# Patient Record
Sex: Male | Born: 1935 | Race: Black or African American | Hispanic: No | Smoking: Former smoker
Health system: Southern US, Community
[De-identification: ages and names within clinical notes are randomized; demographics above are authoritative.]

## PROBLEM LIST (undated history)

## (undated) DIAGNOSIS — E785 Hyperlipidemia, unspecified: Secondary | ICD-10-CM

## (undated) DIAGNOSIS — IMO0001 Reserved for inherently not codable concepts without codable children: Secondary | ICD-10-CM

## (undated) DIAGNOSIS — E119 Type 2 diabetes mellitus without complications: Secondary | ICD-10-CM

## (undated) DIAGNOSIS — I10 Essential (primary) hypertension: Secondary | ICD-10-CM

## (undated) DIAGNOSIS — K5792 Diverticulitis of intestine, part unspecified, without perforation or abscess without bleeding: Secondary | ICD-10-CM

## (undated) DIAGNOSIS — G8929 Other chronic pain: Secondary | ICD-10-CM

## (undated) DIAGNOSIS — D649 Anemia, unspecified: Secondary | ICD-10-CM

## (undated) DIAGNOSIS — M549 Dorsalgia, unspecified: Secondary | ICD-10-CM

## (undated) DIAGNOSIS — F209 Schizophrenia, unspecified: Secondary | ICD-10-CM

## (undated) DIAGNOSIS — F419 Anxiety disorder, unspecified: Secondary | ICD-10-CM

## (undated) DIAGNOSIS — K219 Gastro-esophageal reflux disease without esophagitis: Secondary | ICD-10-CM

---

## 2015-01-18 ENCOUNTER — Emergency Department (HOSPITAL_BASED_OUTPATIENT_CLINIC_OR_DEPARTMENT_OTHER): Payer: Medicare Other

## 2015-01-18 ENCOUNTER — Encounter (HOSPITAL_BASED_OUTPATIENT_CLINIC_OR_DEPARTMENT_OTHER): Payer: Self-pay | Admitting: *Deleted

## 2015-01-18 ENCOUNTER — Emergency Department (HOSPITAL_BASED_OUTPATIENT_CLINIC_OR_DEPARTMENT_OTHER)
Admission: EM | Admit: 2015-01-18 | Discharge: 2015-01-18 | Disposition: A | Discharge status: Other Acute Inpatient Hospital | Payer: Medicare Other | Attending: Emergency Medicine | Admitting: Emergency Medicine

## 2015-01-18 DIAGNOSIS — Z79899 Other long term (current) drug therapy: Secondary | ICD-10-CM | POA: Insufficient documentation

## 2015-01-18 DIAGNOSIS — G4751 Confusional arousals: Secondary | ICD-10-CM | POA: Diagnosis not present

## 2015-01-18 DIAGNOSIS — G8929 Other chronic pain: Secondary | ICD-10-CM | POA: Diagnosis not present

## 2015-01-18 DIAGNOSIS — R5383 Other fatigue: Secondary | ICD-10-CM | POA: Diagnosis not present

## 2015-01-18 DIAGNOSIS — F419 Anxiety disorder, unspecified: Secondary | ICD-10-CM | POA: Insufficient documentation

## 2015-01-18 DIAGNOSIS — Z7984 Long term (current) use of oral hypoglycemic drugs: Secondary | ICD-10-CM | POA: Insufficient documentation

## 2015-01-18 DIAGNOSIS — I10 Essential (primary) hypertension: Secondary | ICD-10-CM | POA: Insufficient documentation

## 2015-01-18 DIAGNOSIS — R634 Abnormal weight loss: Secondary | ICD-10-CM | POA: Insufficient documentation

## 2015-01-18 DIAGNOSIS — C259 Malignant neoplasm of pancreas, unspecified: Secondary | ICD-10-CM | POA: Diagnosis not present

## 2015-01-18 DIAGNOSIS — K831 Obstruction of bile duct: Secondary | ICD-10-CM | POA: Insufficient documentation

## 2015-01-18 DIAGNOSIS — Z862 Personal history of diseases of the blood and blood-forming organs and certain disorders involving the immune mechanism: Secondary | ICD-10-CM | POA: Insufficient documentation

## 2015-01-18 DIAGNOSIS — F209 Schizophrenia, unspecified: Secondary | ICD-10-CM | POA: Diagnosis not present

## 2015-01-18 DIAGNOSIS — R17 Unspecified jaundice: Secondary | ICD-10-CM | POA: Insufficient documentation

## 2015-01-18 DIAGNOSIS — F0390 Unspecified dementia without behavioral disturbance: Secondary | ICD-10-CM | POA: Insufficient documentation

## 2015-01-18 DIAGNOSIS — R4781 Slurred speech: Secondary | ICD-10-CM | POA: Diagnosis not present

## 2015-01-18 DIAGNOSIS — C801 Malignant (primary) neoplasm, unspecified: Secondary | ICD-10-CM

## 2015-01-18 DIAGNOSIS — Z87891 Personal history of nicotine dependence: Secondary | ICD-10-CM | POA: Insufficient documentation

## 2015-01-18 DIAGNOSIS — E119 Type 2 diabetes mellitus without complications: Secondary | ICD-10-CM | POA: Insufficient documentation

## 2015-01-18 DIAGNOSIS — Z7951 Long term (current) use of inhaled steroids: Secondary | ICD-10-CM | POA: Insufficient documentation

## 2015-01-18 DIAGNOSIS — R531 Weakness: Secondary | ICD-10-CM | POA: Insufficient documentation

## 2015-01-18 DIAGNOSIS — R4182 Altered mental status, unspecified: Secondary | ICD-10-CM | POA: Diagnosis present

## 2015-01-18 DIAGNOSIS — K8689 Other specified diseases of pancreas: Secondary | ICD-10-CM | POA: Diagnosis not present

## 2015-01-18 DIAGNOSIS — Z87440 Personal history of urinary (tract) infections: Secondary | ICD-10-CM | POA: Diagnosis not present

## 2015-01-18 HISTORY — DX: Anemia, unspecified: D64.9

## 2015-01-18 HISTORY — DX: Diverticulitis of intestine, part unspecified, without perforation or abscess without bleeding: K57.92

## 2015-01-18 HISTORY — DX: Anxiety disorder, unspecified: F41.9

## 2015-01-18 HISTORY — DX: Type 2 diabetes mellitus without complications: E11.9

## 2015-01-18 HISTORY — DX: Schizophrenia, unspecified: F20.9

## 2015-01-18 HISTORY — DX: Other chronic pain: G89.29

## 2015-01-18 HISTORY — DX: Gastro-esophageal reflux disease without esophagitis: K21.9

## 2015-01-18 HISTORY — DX: Dorsalgia, unspecified: M54.9

## 2015-01-18 HISTORY — DX: Hyperlipidemia, unspecified: E78.5

## 2015-01-18 HISTORY — DX: Essential (primary) hypertension: I10

## 2015-01-18 HISTORY — DX: Reserved for inherently not codable concepts without codable children: IMO0001

## 2015-01-18 LAB — COMPREHENSIVE METABOLIC PANEL
ALBUMIN: 2.5 g/dL — AB (ref 3.5–5.0)
ALT: 163 U/L — AB (ref 17–63)
AST: 248 U/L — AB (ref 15–41)
Alkaline Phosphatase: 1168 U/L — ABNORMAL HIGH (ref 38–126)
Anion gap: 9 (ref 5–15)
BILIRUBIN TOTAL: 10.1 mg/dL — AB (ref 0.3–1.2)
BUN: 9 mg/dL (ref 6–20)
CHLORIDE: 100 mmol/L — AB (ref 101–111)
CO2: 26 mmol/L (ref 22–32)
Calcium: 8.9 mg/dL (ref 8.9–10.3)
Creatinine, Ser: 0.82 mg/dL (ref 0.61–1.24)
GFR calc Af Amer: >60 mL/min (ref >60)
GFR calc non Af Amer: >60 mL/min (ref >60)
GLUCOSE: 57 mg/dL — AB (ref 65–99)
POTASSIUM: 3.6 mmol/L (ref 3.5–5.1)
Sodium: 135 mmol/L (ref 135–145)
Total Protein: 6.8 g/dL (ref 6.5–8.1)

## 2015-01-18 LAB — URINALYSIS, ROUTINE W REFLEX MICROSCOPIC
Glucose, UA: NEGATIVE mg/dL
Hgb urine dipstick: NEGATIVE
Ketones, ur: 15 mg/dL — AB
NITRITE: NEGATIVE
PH: 6 (ref 5.0–8.0)
Protein, ur: NEGATIVE mg/dL
SPECIFIC GRAVITY, URINE: 1.02 (ref 1.005–1.030)

## 2015-01-18 LAB — CBC WITH DIFFERENTIAL/PLATELET
Basophils Absolute: 0 10*3/uL (ref 0.0–0.1)
Basophils Relative: 0 %
EOS ABS: 0 10*3/uL (ref 0.0–0.7)
Eosinophils Relative: 0 %
HEMATOCRIT: 29.8 % — AB (ref 39.0–52.0)
Hemoglobin: 8.9 g/dL — ABNORMAL LOW (ref 13.0–17.0)
LYMPHS ABS: 1.1 10*3/uL (ref 0.7–4.0)
LYMPHS PCT: 16 %
MCH: 27.8 pg (ref 26.0–34.0)
MCHC: 29.9 g/dL — ABNORMAL LOW (ref 30.0–36.0)
MCV: 93.1 fL (ref 78.0–100.0)
Monocytes Absolute: 0.9 10*3/uL (ref 0.1–1.0)
Monocytes Relative: 13 %
NEUTROS ABS: 4.6 10*3/uL (ref 1.7–7.7)
NEUTROS PCT: 70 %
Platelets: 377 10*3/uL (ref 150–400)
RBC: 3.2 MIL/uL — AB (ref 4.22–5.81)
RDW: 16.9 % — AB (ref 11.5–15.5)
WBC: 6.6 10*3/uL (ref 4.0–10.5)

## 2015-01-18 LAB — URINE MICROSCOPIC-ADD ON: RBC / HPF: NONE SEEN RBC/hpf (ref 0–5)

## 2015-01-18 LAB — I-STAT CG4 LACTIC ACID, ED: Lactic Acid, Venous: 2.7 mmol/L (ref 0.5–2.0)

## 2015-01-18 MED ORDER — IOHEXOL 300 MG/ML  SOLN
100.0000 mL | Freq: Once | INTRAMUSCULAR | Status: AC | PRN
  Administered 2015-01-18: 100 mL via INTRAVENOUS

## 2015-01-18 MED ORDER — SODIUM CHLORIDE 0.9 % IV SOLN
Freq: Once | INTRAVENOUS | Status: AC
  Administered 2015-01-18: 16:00:00 via INTRAVENOUS

## 2015-01-18 MED ORDER — SODIUM CHLORIDE 0.9 % IV BOLUS (SEPSIS)
1000.0000 mL | Freq: Once | INTRAVENOUS | Status: AC
  Administered 2015-01-18: 1000 mL via INTRAVENOUS

## 2015-01-18 NOTE — ED Notes (Signed)
Report to [facility] at bedside, pt care transferred, pt and daughter are aware of pending transport and inpatient admit to [facility] room c-303.

## 2015-01-18 NOTE — ED Notes (Signed)
Pt placed on NaCL @ [facility].

## 2015-01-18 NOTE — ED Provider Notes (Signed)
CSN: [id number]     Arrival date & time 01/18/15  1104 History   First MD Initiated Contact with Patient 01/18/15 1108     Chief Complaint  Patient presents with  . Jaundice     (Consider location/radiation/quality/duration/timing/severity/associated sxs/prior Treatment) Patient is a 82 y.o. male presenting with altered mental status. The history is provided by the nursing home and a relative.  Altered Mental Status Presenting symptoms: confusion and lethargy (worsening over last 2 weeks)   Severity:  Moderate Most recent episode:  More than 2 days ago Episode history:  Continuous Timing:  Constant Progression:  Unchanged Chronicity:  New Context: dementia and nursing home resident   Associated symptoms: slurred speech and weakness   Associated symptoms: no abdominal pain and no fever   Associated symptoms comment:  Progressive jaundice per daughter   Past Medical History  Diagnosis Date  . Diverticulitis   . Diabetes mellitus without complication ([place])   . Hypertension   . Chronic back pain   . Schizophrenia ([place])   . Reflux   . Anemia   . Anxiety   . HLD (hyperlipidemia)    History reviewed. No pertinent past surgical history. History reviewed. No pertinent family history. Social History  Substance Use Topics  . Smoking status: Former Research scientist (life sciences)  . Smokeless tobacco: None  . Alcohol Use: None    Review of Systems  Constitutional: Positive for appetite change, fatigue and unexpected weight change (loss). Negative for fever.  Gastrointestinal: Negative for abdominal pain.  Skin: Positive for color change (yellow).  Neurological: Positive for weakness.  Psychiatric/Behavioral: Positive for confusion.  All other systems reviewed and are negative.     Allergies  Review of patient's allergies indicates no known allergies.  Home Medications   Prior to Admission medications   Medication Sig Start Date End Date Taking? Authorizing Provider  ciprofloxacin (CIPRO)  500 MG tablet Take 500 mg by mouth 2 (two) times daily.   Yes Historical Provider, MD  divalproex (DEPAKOTE) 500 MG DR tablet Take 1,500 mg by mouth daily.   Yes Historical Provider, MD  fluticasone (VERAMYST) 27.5 MCG/SPRAY nasal spray Place 2 sprays into the nose daily.   Yes Historical Provider, MD  glimepiride (AMARYL) 4 MG tablet Take 4 mg by mouth daily with breakfast.   Yes Historical Provider, MD  HYDROcodone-acetaminophen (NORCO/VICODIN) 5-325 MG tablet Take 1 tablet by mouth every 6 (six) hours as needed for moderate pain.   Yes Historical Provider, MD  hydrOXYzine (ATARAX/VISTARIL) 25 MG tablet Take 25 mg by mouth 3 (three) times daily as needed.   Yes Historical Provider, MD  lisinopril (PRINIVIL,ZESTRIL) 5 MG tablet Take 5 mg by mouth daily.   Yes Historical Provider, MD  loratadine (CLARITIN) 10 MG tablet Take 10 mg by mouth daily.   Yes Historical Provider, MD  metFORMIN (GLUCOPHAGE) 1000 MG tablet Take 1,000 mg by mouth 2 (two) times daily with a meal.   Yes Historical Provider, MD  risperiDONE (RISPERDAL) 2 MG tablet Take 2 mg by mouth at bedtime.   Yes Historical Provider, MD  risperiDONE microspheres (RISPERDAL CONSTA) 25 MG injection Inject 25 mg into the muscle every 14 (fourteen) days.   Yes Historical Provider, MD  VITAMIN D, CHOLECALCIFEROL, PO Take by mouth.   Yes Historical Provider, MD  White Petrolatum-Mineral Oil (SYSTANE NIGHTTIME) OINT Apply to eye.   Yes Historical Provider, MD   BP 119/55 mmHg  Pulse 73  Temp(Src) 99.3 F (37.4 C) (Oral)  Resp 18  Ht  5\' 7"  (1.702 m)  Wt 172 lb (78.019 kg)  BMI 26.93 kg/m2  SpO2 96% Physical Exam  Constitutional: He appears well-developed. He appears cachectic. No distress.  HENT:  Head: Normocephalic and atraumatic.  Eyes: Conjunctivae are normal.  Scleral icterus  Neck: Neck supple. No tracheal deviation present.  Cardiovascular: Normal rate, regular rhythm and normal heart sounds.   Pulmonary/Chest: Effort normal and  breath sounds normal. No respiratory distress.  Abdominal: Soft. He exhibits no distension. There is no tenderness.  Neurological: He is alert. He is disoriented. GCS eye subscore is 4. GCS verbal subscore is 4. GCS motor subscore is 6.  Skin: Skin is warm and dry.  Jaundiced  Psychiatric: He has a normal mood and affect.    ED Course  Procedures (including critical care time) Labs Review Labs Reviewed  CBC WITH DIFFERENTIAL/PLATELET - Abnormal; Notable for the following:    RBC 3.20 (*)    Hemoglobin 8.9 (*)    HCT 29.8 (*)    MCHC 29.9 (*)    RDW 16.9 (*)    All other components within normal limits  COMPREHENSIVE METABOLIC PANEL - Abnormal; Notable for the following:    Chloride 100 (*)    Glucose, Bld 57 (*)    Albumin 2.5 (*)    AST 248 (*)    ALT 163 (*)    Alkaline Phosphatase 1168 (*)    Total Bilirubin 10.1 (*)    All other components within normal limits  URINALYSIS, ROUTINE W REFLEX MICROSCOPIC (NOT AT [facility]) - Abnormal; Notable for the following:    Color, Urine ORANGE (*)    Bilirubin Urine LARGE (*)    Ketones, ur 15 (*)    Leukocytes, UA SMALL (*)    All other components within normal limits  URINE MICROSCOPIC-ADD ON - Abnormal; Notable for the following:    Squamous Epithelial / LPF 0-5 (*)    Bacteria, UA MANY (*)    Casts GRANULAR CAST (*)    All other components within normal limits  I-STAT CG4 LACTIC ACID, ED - Abnormal; Notable for the following:    Lactic Acid, Venous 2.70 (*)    All other components within normal limits  URINE CULTURE    Imaging Review Dg Chest 2 View  01/18/2015  CLINICAL DATA:  Confusion and fatigue. History of recent urinary tract infection. EXAM: CHEST  2 VIEW COMPARISON:  None. FINDINGS: Normal cardiac silhouette given reduced lung volumes. There is partial obscuration involving the superior aspect the right hilum and caudal aspect of the right tracheal air column, potentially secondary to overlying osseous structures. There is  a punctate, presumably calcified approximately 5 mm nodule overlying the peripheral aspect the right upper lung. Minimal bibasilar linear heterogeneous opacities favored to represent atelectasis. No discrete focal airspace opacities. No pleural effusion or pneumothorax. No evidence of edema. No acute osseous abnormalities. IMPRESSION: 1. Partial obscuration of the superior aspect the right hilum, potentially secondary to overlying osseous structures though a developing airspace opacity, hilar adenopathy or medially located pulmonary nodule or mass could all result in a similar appearance. Correlation with prior outside examinations (if available) is recommended. Otherwise, a follow-up chest radiograph in 3 to 4 weeks after treatment is recommended to ensure resolution. 2. Punctate granuloma overlies the peripheral aspect the right upper lung. Electronically Signed   By: Sandi Mariscal M.D.   On: 01/18/2015 12:36   Ct Abdomen Pelvis W Contrast  01/18/2015  CLINICAL DATA:  Weakness.  Recent jaundice. EXAM:  CT ABDOMEN AND PELVIS WITH CONTRAST TECHNIQUE: Multidetector CT imaging of the abdomen and pelvis was performed using the standard protocol following bolus administration of intravenous contrast. CONTRAST:  178mL OMNIPAQUE IOHEXOL 300 MG/ML  SOLN COMPARISON:  None. FINDINGS: There is an ill-defined approximately 3.1 x 1.6 cm hypo attenuating mass within the caudal aspect of the the head/neck of the pancreas (representative axial image 38, series 2; coronal image 38, series 5) which is associated with moderate dilatation of the common bile duct measuring approximately 1.7 cm in maximal oblique coronal diameter (coronal image 41, series 5) and minimal centralized intrahepatic biliary duct dilatation. There is no definitive pancreatic ductal dilatation or downstream pancreatic atrophy. The mass appears to abut the right caudal aspect of the proximal SMA (coronal image 37, series 5) and result in short segment occlusion  of 1 of the proximal branch vessels of the SMV (image 32, series 5). The portal vein remains widely patent. Scattered porta hepatis and upper retroperitoneal lymph nodes are numerous though individually not enlarged by size criteria with index gastrohepatic lymph node measuring 0.9 cm in greatest short axis diameter (image 27, series 2). No definitive bulky retroperitoneal, mesenteric, pelvic or inguinal lymphadenopathy. Normal hepatic contour. There are multiple (approximately 5 ill-defined hypo attenuating hepatic lesions, largest of which within the caudal aspect of the right lobe of the liver measures approximately 9 mm in diameter (image 28, series 2), all of which are too small to accurately characterize though in the setting of a presumed pancreatic malignancy are worrisome for metastatic disease. Normal appearance of the gallbladder. No radiopaque gallstones. No ascites. There is symmetric enhancement and excretion of the bilateral kidneys. Note is made of an approximately 1.9 cm hypo attenuating (5 Hounsfield unit) partially exophytic cyst arising from the anterior cranial aspect of the left kidney (image 11, series 7). Additional bilateral sub cm hypoattenuating renal lesions too small to adequately characterize of favored to represent additional renal cysts. No definite renal stones this postcontrast examination. No urinary obstruction or perinephric stranding. Normal appearance of the bilateral adrenal glands and spleen. Scattered colonic diverticulosis without evidence of diverticulitis. Moderate colonic stool burden without evidence of enteric obstruction. Normal appearance of the terminal ileum and retrocecal appendix. No pneumoperitoneum, pneumatosis or portal venous gas. Scattered atherosclerotic plaque within a normal caliber abdominal aorta. The major branch vessels of the abdominal aorta appear patent on this non CTA examination. Limited visualization of the lower thorax demonstrates a  approximately 0.5 cm) nodule within the subpleural aspect of the image right middle lobe (image 1, series 3) with associated minimal amount of adjacent surrounding ground-glass. There is minimal dependent subpleural ground-glass atelectasis, right greater than left. No pleural effusion. Normal heart size.  No pericardial effusion. No acute or aggressive osseous abnormalities. Moderate multilevel lumbar spine DDD. Mild degenerative change of the bilateral hips, right greater than left with joint space loss, subchondral sclerosis and osteophytosis. Small mesenteric fat containing left-sided indirect inguinal hernia. Mild diffuse body wall anasarca, most conspicuous about the midline of the low back. IMPRESSION: 1. Ill-defined approximately 3.1 cm hypo attenuating mass arising from the caudal aspect of the head/neck of the pancreas worrisome for a primary pancreatic malignancy. This finding is associated with moderate dilatation of the CBD and mild centralized intrahepatic biliary duct dilatation. There is no definitive pancreatic duct dilatation or downstream pancreatic atrophy. Further evaluation with EUS could be performed as indicated 2. While suboptimally evaluated on this non pancreatic protocol CT, the ill-defined pancreatic neoplasm appears to abut  the SMA and results in short segment occlusion of one of the proximal branches of the SMV. The portal vein and splenoportal confluence appear widely patent and uninvolved. 3. Scattered punctate hypoattenuating hepatic lesions (the largest of which measures approximately 9 mm), too small to accurately characterize though worrisome for metastatic disease in the setting of presumed pancreatic malignancy. Further evaluation could be performed with abdominal MRI as indicated. 4. Punctate (approximately 5 mm) nodule within imaged subpleural aspect of the right middle lobe - indeterminate though pulmonary metastatic disease is not excluded on the basis of this examination.  Comparison with prior outside examinations is recommended. If outside examinations are unavailable or do not exist, continued attention on follow-up is recommended. 5. Colonic diverticulosis without evidence of diverticulitis. Electronically Signed   By: Sandi Mariscal M.D.   On: 01/18/2015 14:38   I have personally reviewed and evaluated these images and lab results as part of my medical decision-making.   EKG Interpretation None      MDM   Final diagnoses:  Pancreatic mass  Biliary obstruction due to malignant neoplasm    82 year old male presents with progressive jaundice, confusion, lethargy over the last 2-3 weeks. He was diagnosed with a urinary tract infection at his facility due to positive nitrites likely related to severe hyperbilirubinemia. Daughter states he has had loss of appetite, weight loss and progressive decline for even longer than this. His liver enzymes are markedly elevated and his clinical jaundice indicate abdominal imaging. CT demonstrates new pancreatic head mass obstructing the biliary tree. Discussed poor prognosis with his daughter who is available at bedside, recommended medical admission for evaluation and decompression. No fever, no elevation in white blood cell count, no significant vital sign abnormalities to suggest developing infection currently. Discussed with Dr. Lorelle Gibbs of [facility] who accepted the patient in transfer for further care.    Leo Grosser, MD 01/18/15 1515

## 2015-01-18 NOTE — ED Notes (Signed)
Pt to room 5 by ems, pt is a/a, pleasantly confused to his baseline ms per ems report. Ems reports they were called to [facility] for report of pt being seen and dx with uti on Monday, cont with confusion and fatigue, request recheck here today. MAR does not show administration of any antibiotics. This rn will call [facility] to inquire. Pt denies any pain, denies any c/o, states "I just don't want to eat, I got no appetite."

## 2015-01-18 NOTE — ED Notes (Signed)
Baker Janus at [facility] pt is on cipro 500mg  and had his first dose this morning.

## 2015-01-18 NOTE — ED Notes (Signed)
No medical history was sent with pt, only face/demographic sheet and mar's. Baker Janus of [facility] states she will fax asap.

## 2015-01-18 NOTE — ED Notes (Signed)
Critical Lactic Acid 2.70 reported to Dr. Laneta Simmers

## 2015-01-19 LAB — URINE CULTURE: Culture: 1000

## 2017-08-25 IMAGING — CT CT ABD-PELV W/ CM
2 of 5 series · 12 of 46 positions shown, 14 images · IV contrast (APPLIED)
Comparison: None.

CLINICAL DATA: Weakness.  Recent jaundice.

EXAM:
CT ABDOMEN AND PELVIS WITH CONTRAST
TECHNIQUE: Multidetector CT imaging of the abdomen and pelvis was performed
using the standard protocol following bolus administration of
intravenous contrast.
CONTRAST:  100mL OMNIPAQUE IOHEXOL 300 MG/ML  SOLN

[Series 2: axial st · axial · 0.95mm/px · z∈[-537,-57]mm · 9 of 120 slices shown, 11 images]
[im 12/120  soft-tissue]
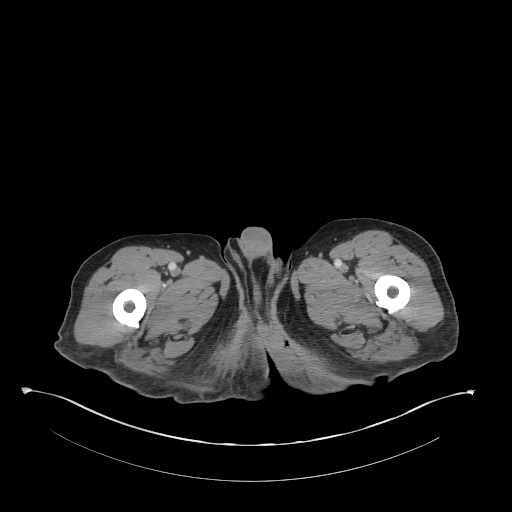
[im 12/120  bone]
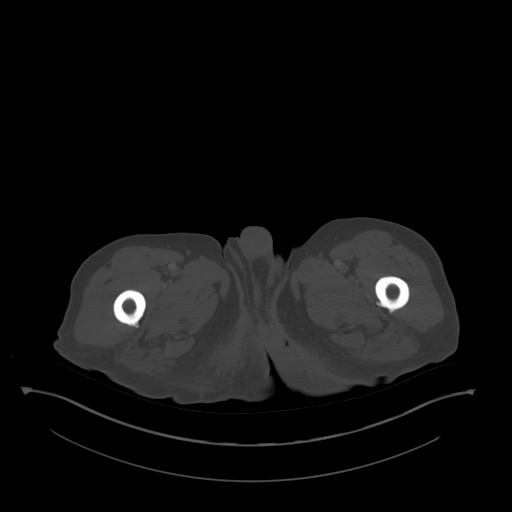
[im 23/120  soft-tissue]
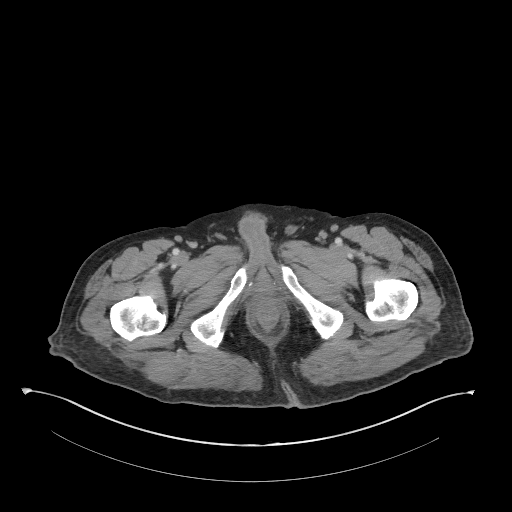
[im 35/120  soft-tissue]
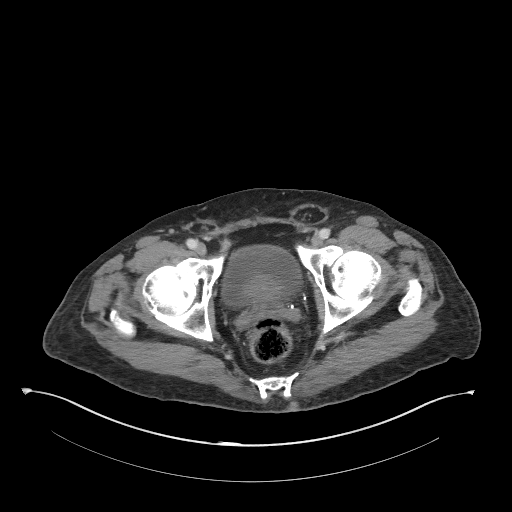
[im 46/120  soft-tissue]
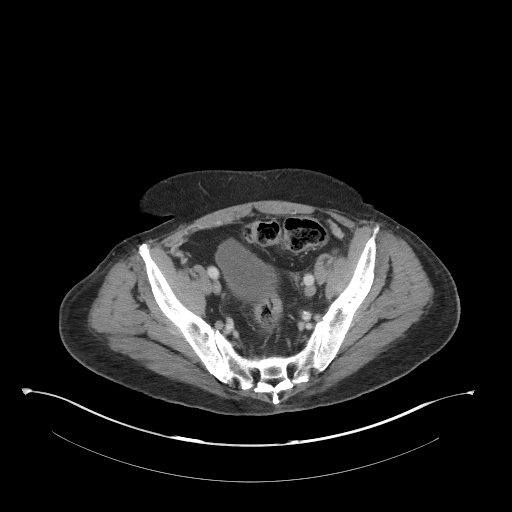
[im 63/120  soft-tissue]
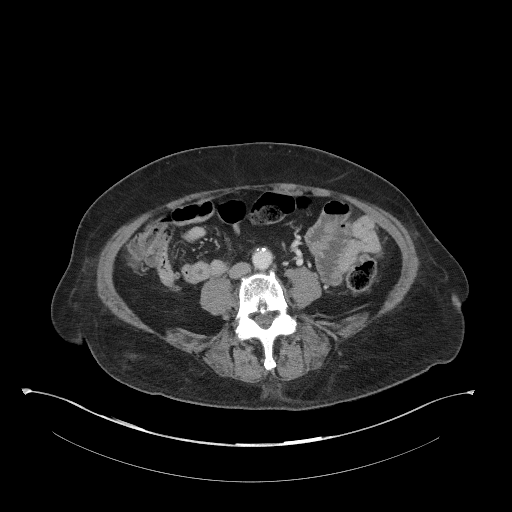
[im 74/120  soft-tissue]
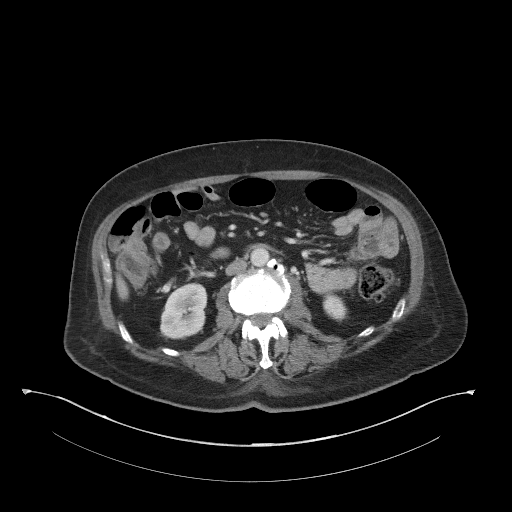
[im 86/120  soft-tissue]
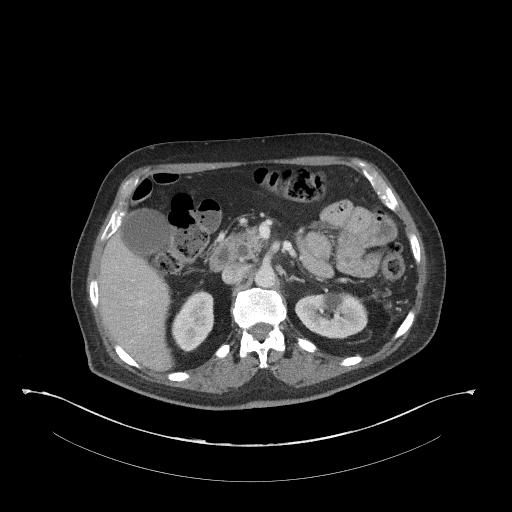
[im 97/120  soft-tissue]
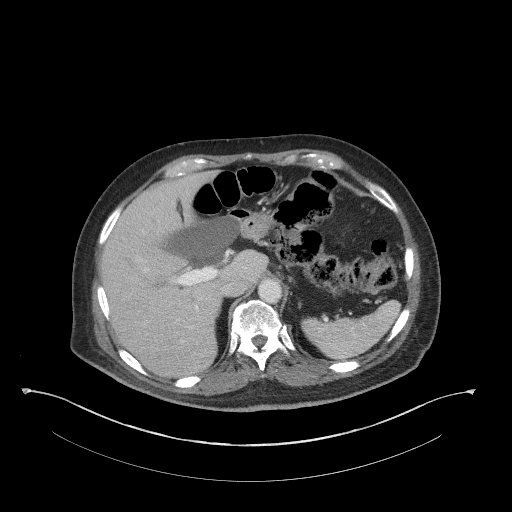
[im 108/120  soft-tissue]
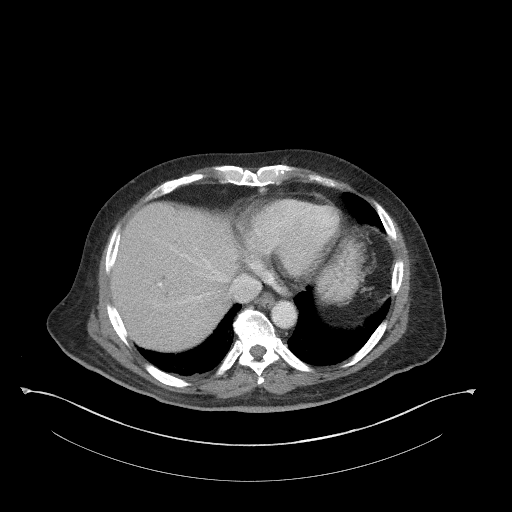
[im 108/120  bone]
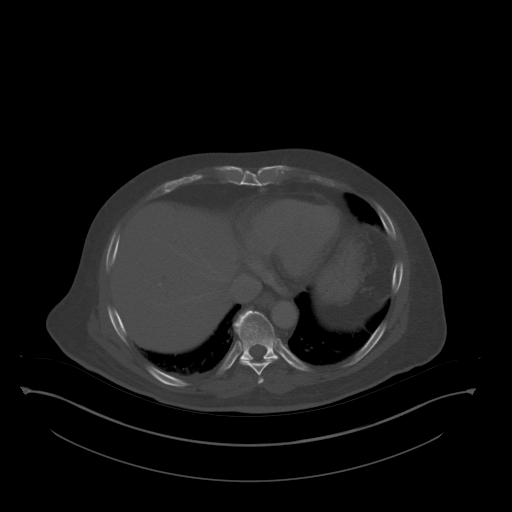

[Series 5: coronal st · coronal · 0.83mm/px · 3 of 92 slices shown]
[im 31/92  soft-tissue]
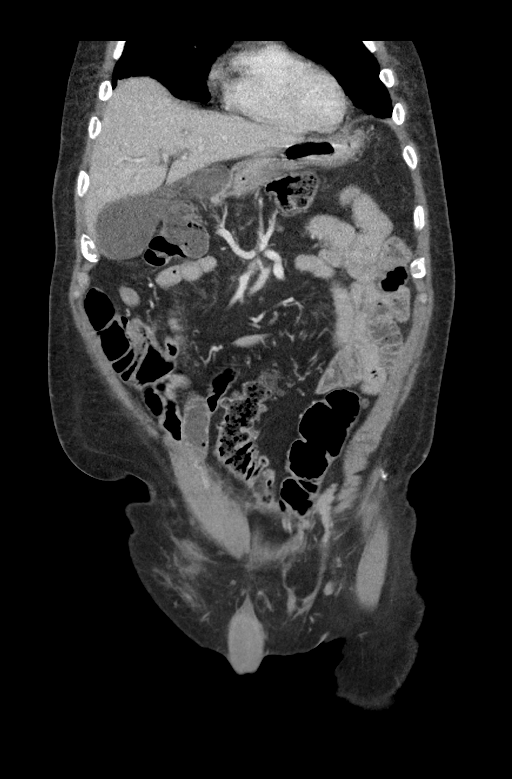
[im 41/92  soft-tissue]
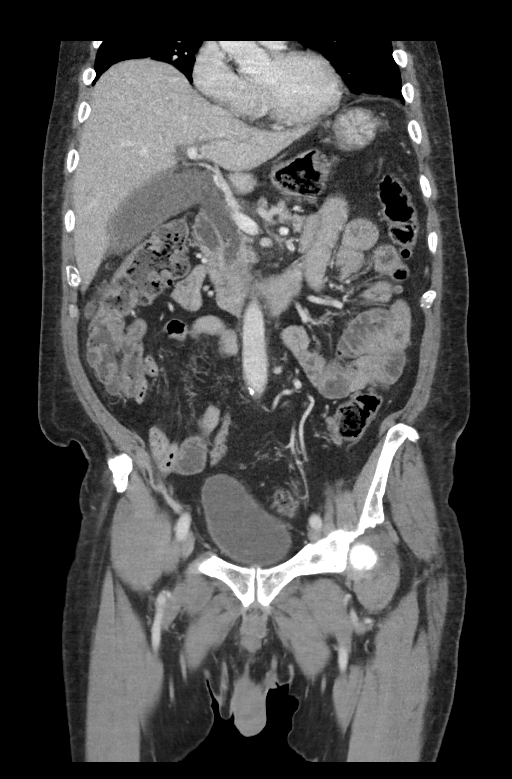
[im 51/92  soft-tissue]
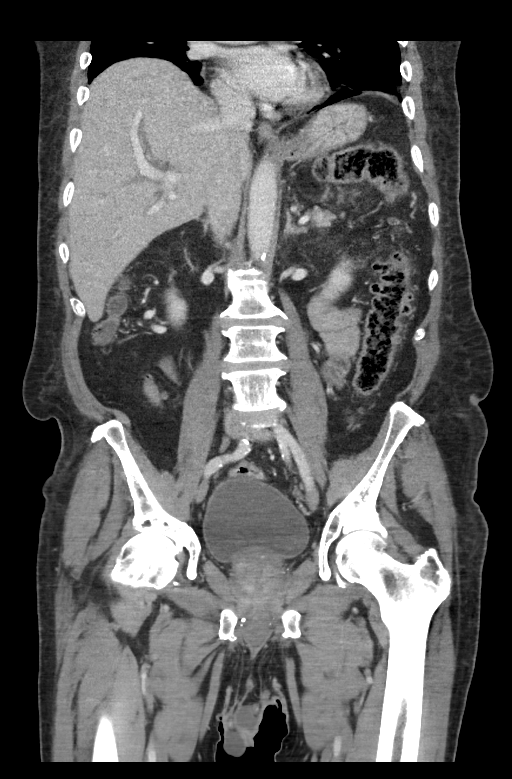

[12 of 46 positions shown; findings below may reference images not displayed]

FINDINGS: There is an ill-defined approximately 3.1 x 1.6 cm hypo attenuating
mass within the caudal aspect of the the head/neck of the pancreas
(representative axial image 38, series 2; coronal image 38, series
5) which is associated with moderate dilatation of the common bile
duct measuring approximately 1.7 cm in maximal oblique coronal
diameter (coronal image 41, series 5) and minimal centralized
intrahepatic biliary duct dilatation. There is no definitive
pancreatic ductal dilatation or downstream pancreatic atrophy. The
mass appears to abut the right caudal aspect of the proximal SMA
(coronal image 37, series 5) and result in short segment occlusion
of 1 of the proximal branch vessels of the SMV (image 32, series 5).
The portal vein remains widely patent.

Scattered porta hepatis and upper retroperitoneal lymph nodes are
numerous though individually not enlarged by size criteria with
index gastrohepatic lymph node measuring 0.9 cm in greatest short
axis diameter (image 27, series 2). No definitive bulky
retroperitoneal, mesenteric, pelvic or inguinal lymphadenopathy.

Normal hepatic contour. There are multiple (approximately 5
ill-defined hypo attenuating hepatic lesions, largest of which
within the caudal aspect of the right lobe of the liver measures
approximately 9 mm in diameter (image 28, series 2), all of which
are too small to accurately characterize though in the setting of a
presumed pancreatic malignancy are worrisome for metastatic disease.

Normal appearance of the gallbladder. No radiopaque gallstones. No
ascites.

There is symmetric enhancement and excretion of the bilateral
kidneys. Note is made of an approximately 1.9 cm hypo attenuating (5
Hounsfield unit) partially exophytic cyst arising from the anterior
cranial aspect of the left kidney (image 11, series 7). Additional
bilateral sub cm hypoattenuating renal lesions too small to
adequately characterize of favored to represent additional renal
cysts. No definite renal stones this postcontrast examination. No
urinary obstruction or perinephric stranding.

Normal appearance of the bilateral adrenal glands and spleen.

Scattered colonic diverticulosis without evidence of diverticulitis.
Moderate colonic stool burden without evidence of enteric
obstruction. Normal appearance of the terminal ileum and retrocecal
appendix. No pneumoperitoneum, pneumatosis or portal venous gas.

Scattered atherosclerotic plaque within a normal caliber abdominal
aorta. The major branch vessels of the abdominal aorta appear patent
on this non CTA examination.

Limited visualization of the lower thorax demonstrates a
approximately 0.5 cm) nodule within the subpleural aspect of the
image right middle lobe (image 1, series 3) with associated minimal
amount of adjacent surrounding ground-glass. There is minimal
dependent subpleural ground-glass atelectasis, right greater than
left. No pleural effusion.

Normal heart size.  No pericardial effusion.

No acute or aggressive osseous abnormalities. Moderate multilevel
lumbar spine DDD. Mild degenerative change of the bilateral hips,
right greater than left with joint space loss, subchondral sclerosis
and osteophytosis.

Small mesenteric fat containing left-sided indirect inguinal hernia.
Mild diffuse body wall anasarca, most conspicuous about the midline
of the low back.
IMPRESSION: 1. Ill-defined approximately 3.1 cm hypo attenuating mass arising
from the caudal aspect of the head/neck of the pancreas worrisome
for a primary pancreatic malignancy. This finding is associated with
moderate dilatation of the CBD and mild centralized intrahepatic
biliary duct dilatation. There is no definitive pancreatic duct
dilatation or downstream pancreatic atrophy. Further evaluation with
EUS could be performed as indicated
2. While suboptimally evaluated on this non pancreatic protocol CT,
the ill-defined pancreatic neoplasm appears to abut the SMA and
results in short segment occlusion of one of the proximal branches
of the SMV. The portal vein and splenoportal confluence appear
widely patent and uninvolved.
3. Scattered punctate hypoattenuating hepatic lesions (the largest
of which measures approximately 9 mm), too small to accurately
characterize though worrisome for metastatic disease in the setting
of presumed pancreatic malignancy. Further evaluation could be
performed with abdominal MRI as indicated.
4. Punctate (approximately 5 mm) nodule within imaged subpleural
aspect of the right middle lobe - indeterminate though pulmonary
metastatic disease is not excluded on the basis of this examination.
Comparison with prior outside examinations is recommended. If
outside examinations are unavailable or do not exist, continued
attention on follow-up is recommended.
5. Colonic diverticulosis without evidence of diverticulitis.
